# Patient Record
Sex: Female | Born: 1935 | Race: White | Hispanic: No | Marital: Married
Health system: Southern US, Community
[De-identification: ages and names within clinical notes are randomized; demographics above are authoritative.]

---

## 2004-12-18 ENCOUNTER — Ambulatory Visit: Payer: Self-pay | Admitting: Internal Medicine

## 2005-04-25 ENCOUNTER — Ambulatory Visit: Payer: Self-pay | Admitting: Internal Medicine

## 2005-06-27 ENCOUNTER — Ambulatory Visit: Payer: Self-pay | Admitting: Oncology

## 2005-07-10 ENCOUNTER — Ambulatory Visit: Payer: Self-pay | Admitting: Oncology

## 2005-08-25 ENCOUNTER — Inpatient Hospital Stay: Payer: Self-pay | Admitting: Infectious Diseases

## 2005-08-31 ENCOUNTER — Inpatient Hospital Stay (HOSPITAL_COMMUNITY): Admission: AD | Admit: 2005-08-31 | Discharge: 2005-09-11 | Payer: Self-pay | Admitting: Hospitalist

## 2005-08-31 ENCOUNTER — Ambulatory Visit: Payer: Self-pay | Admitting: Physical Medicine & Rehabilitation

## 2005-09-01 ENCOUNTER — Ambulatory Visit: Payer: Self-pay | Admitting: Hospitalist

## 2005-09-01 ENCOUNTER — Ambulatory Visit: Payer: Self-pay | Admitting: Oncology

## 2005-09-21 ENCOUNTER — Ambulatory Visit: Payer: Self-pay | Admitting: *Deleted

## 2005-09-24 ENCOUNTER — Emergency Department (HOSPITAL_COMMUNITY): Admission: EM | Admit: 2005-09-24 | Discharge: 2005-09-24 | Payer: Self-pay | Admitting: Emergency Medicine

## 2005-10-15 ENCOUNTER — Ambulatory Visit: Payer: Self-pay | Admitting: Internal Medicine

## 2005-11-30 ENCOUNTER — Ambulatory Visit: Payer: Self-pay | Admitting: *Deleted

## 2006-01-22 ENCOUNTER — Ambulatory Visit: Payer: Self-pay | Admitting: *Deleted

## 2006-01-31 ENCOUNTER — Ambulatory Visit: Payer: Self-pay | Admitting: *Deleted

## 2006-03-25 ENCOUNTER — Ambulatory Visit: Payer: Self-pay | Admitting: *Deleted

## 2006-03-29 ENCOUNTER — Ambulatory Visit: Payer: Self-pay | Admitting: *Deleted

## 2006-07-08 ENCOUNTER — Ambulatory Visit: Payer: Self-pay | Admitting: Oncology

## 2006-07-10 ENCOUNTER — Ambulatory Visit: Payer: Self-pay | Admitting: Oncology

## 2007-01-08 ENCOUNTER — Ambulatory Visit: Payer: Self-pay | Admitting: Oncology

## 2007-07-11 ENCOUNTER — Ambulatory Visit: Payer: Self-pay | Admitting: Oncology

## 2007-07-16 ENCOUNTER — Ambulatory Visit: Payer: Self-pay | Admitting: Oncology

## 2007-07-23 ENCOUNTER — Ambulatory Visit: Payer: Self-pay | Admitting: Oncology

## 2007-07-23 IMAGING — CT CT ABD-PELV W/ CM
1 of 2 series · 15 of 32 positions shown, 19 images · non-contrast
Comparison: none

REASON FOR EXAM: Pelvic mass
COMMENTS:

PROCEDURE:     CT  - CT ABDOMEN / PELVIS  W  - July 13, 2005  [DATE]
RESULT:     There are no prior similar studies available for comparison.

[Series 2: soft tissue · axial · 0.65mm/px · z∈[-610,-226]mm · 15 of 54 slices shown, 19 images]
[im 3/54  soft-tissue]
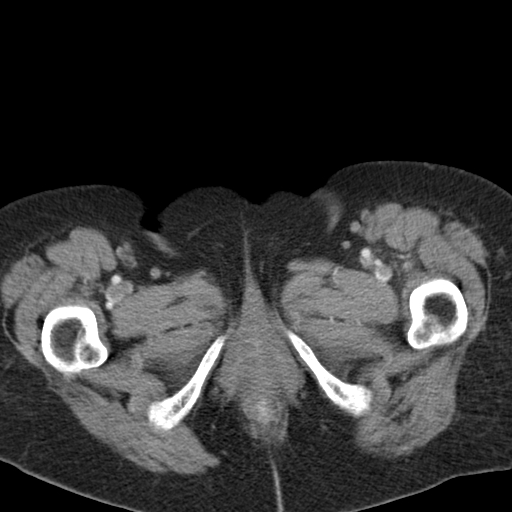
[im 3/54  bone]
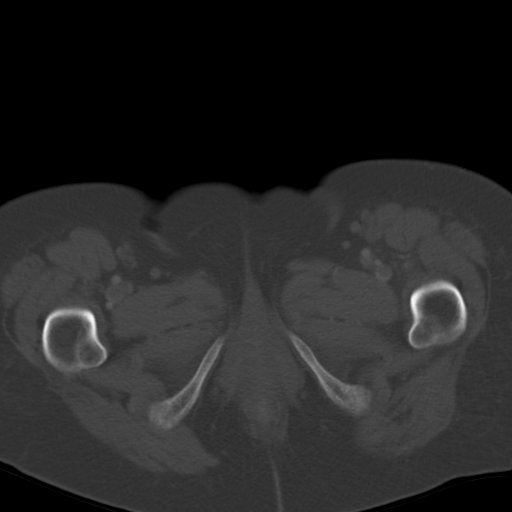
[im 7/54  soft-tissue]
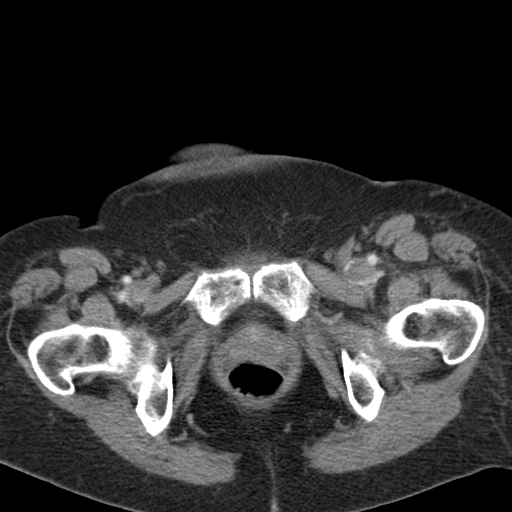
[im 11/54  soft-tissue]
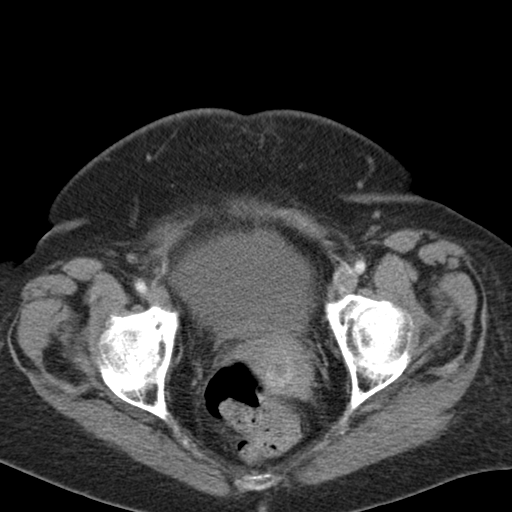
[im 15/54  soft-tissue]
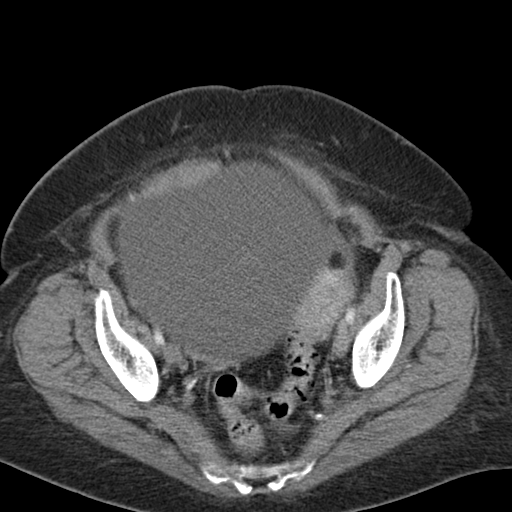
[im 20/54  soft-tissue]
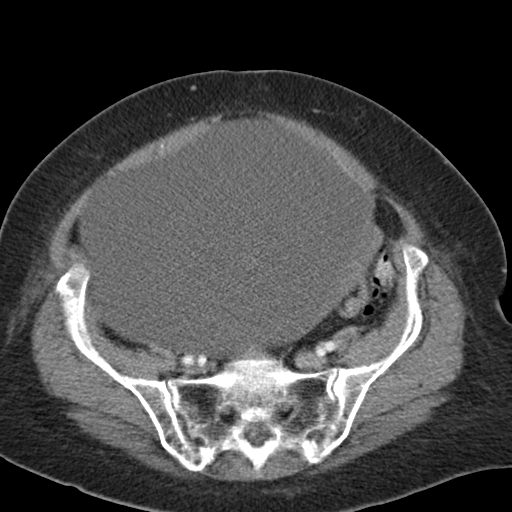
[im 24/54  soft-tissue]
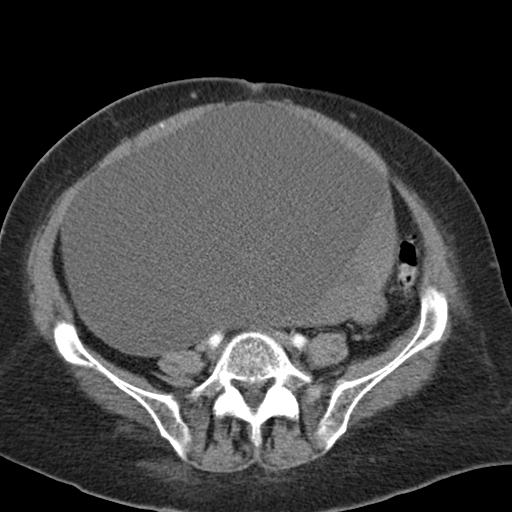
[im 28/54  soft-tissue]
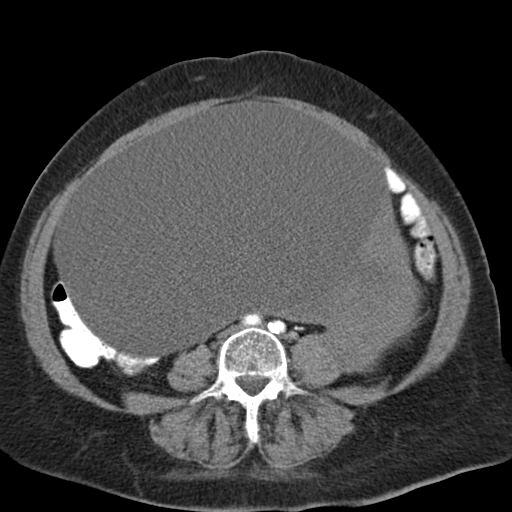
[im 30/54  soft-tissue]
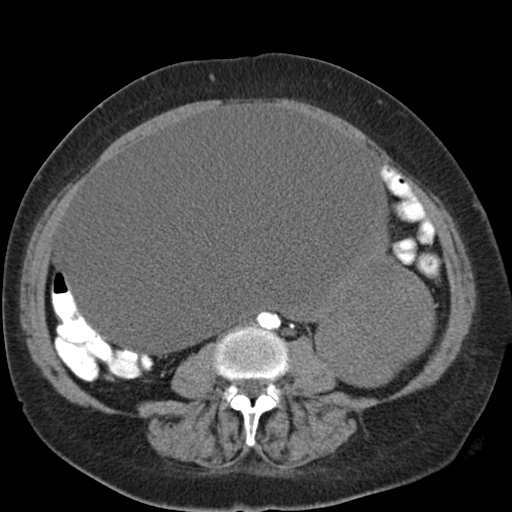
[im 34/54  soft-tissue]
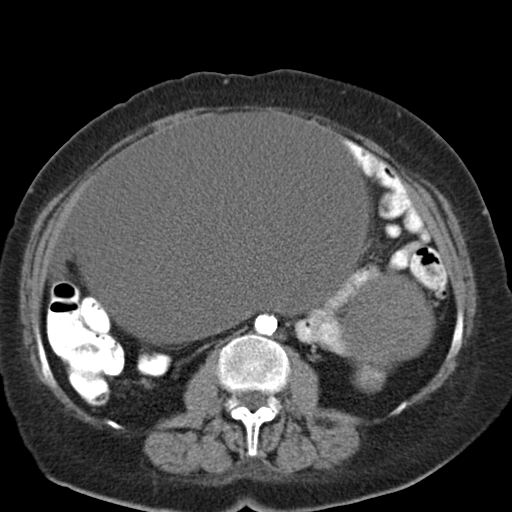
[im 34/54  bone]
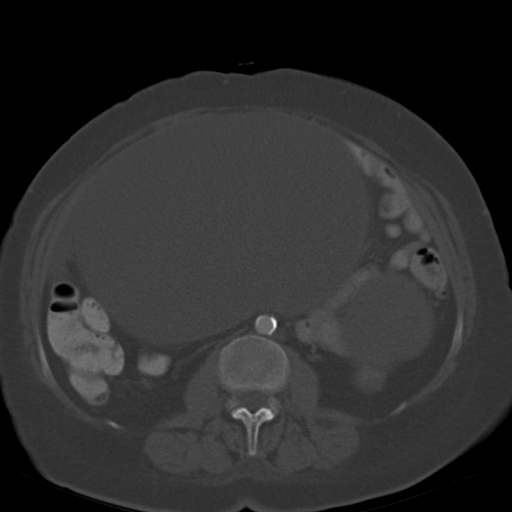
[im 39/54  soft-tissue]
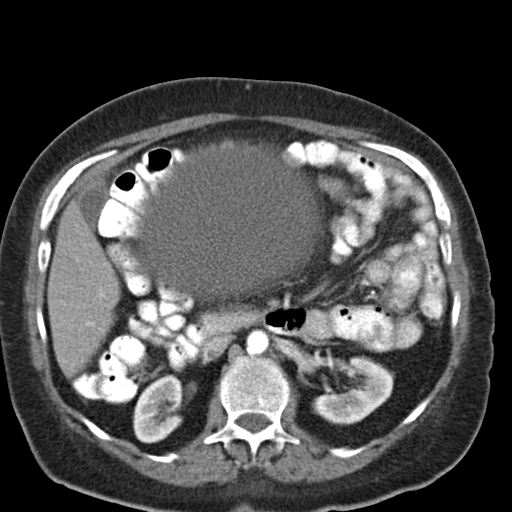
[im 43/54  soft-tissue]
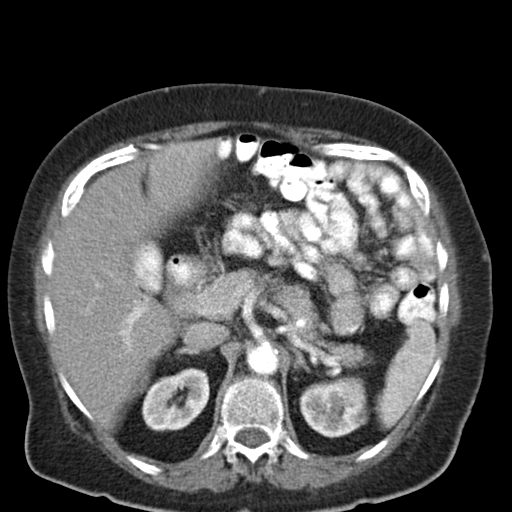
[im 45/54  lung]
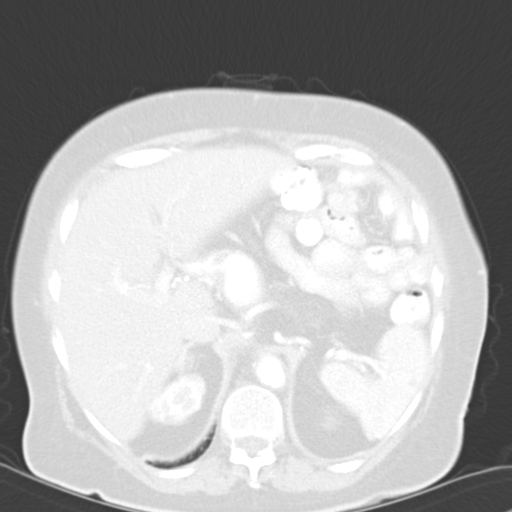
[im 47/54  soft-tissue]
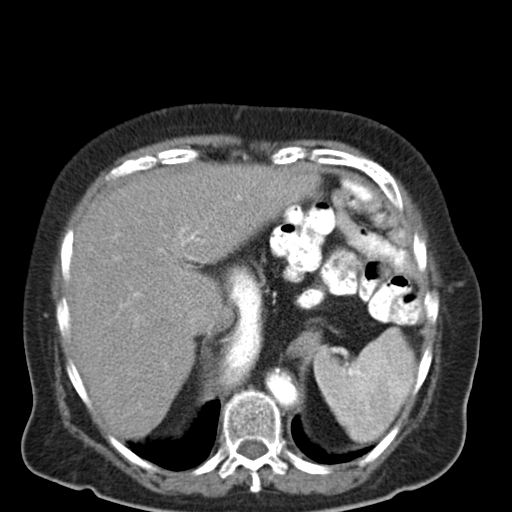
[im 47/54  lung]
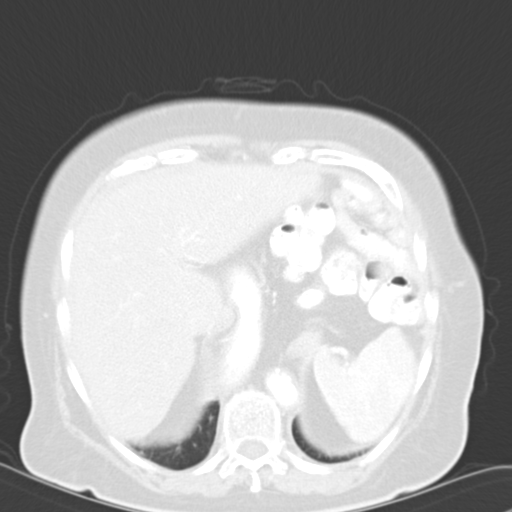
[im 49/54  lung]
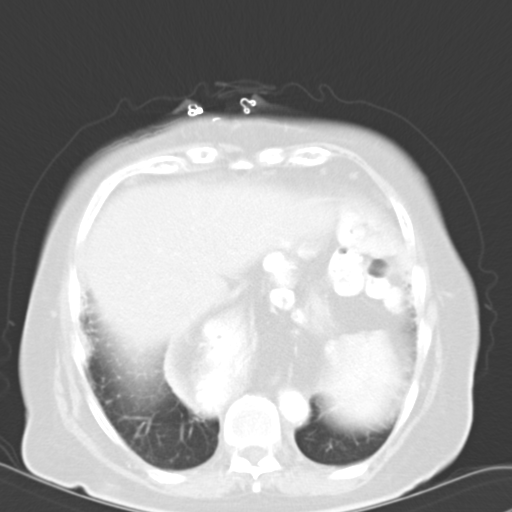
[im 51/54  soft-tissue]
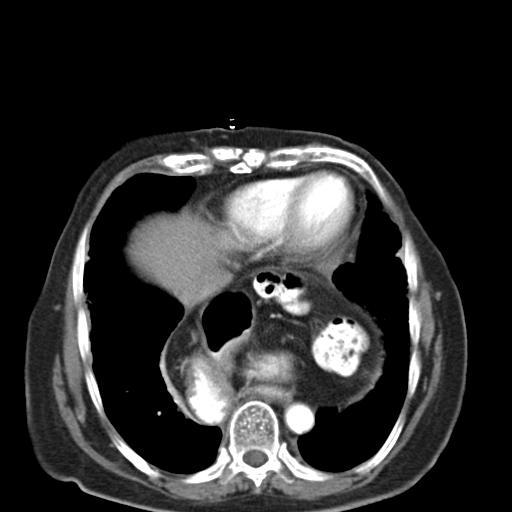
[im 51/54  lung]
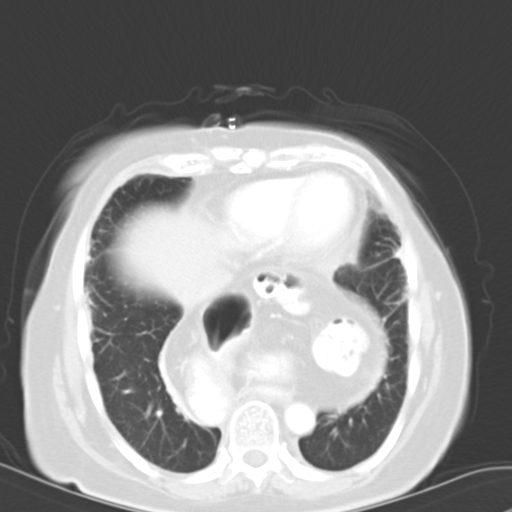

[15 of 32 positions shown; findings below may reference images not displayed]

FINDINGS: There is a large, low density mass arising from the pelvis which
appears to displace the uterus to the LEFT suggesting this may be a large,
RIGHT ovarian mass. This does have a soft tissue component about its LEFT
lateral margin near the level of the iliac crest and some lobulation with
low density superior to that, extending up to the lower pole of the LEFT
kidney. The superior extent of the mass is just above the level of the lower
margin of the RIGHT lobe of the liver. There is a large, hiatal hernia
containing a loop of colon as well as the stomach. This appears to be
non-obstructing. The liver and spleen are unremarkable. There is some low
density seen along the inferior aspect of the liver which may represent the
gallbladder. This is nondistended. The mass measures approximately 25.0 cm
from superior to inferior and shows a maximal transverse diameter extending
completely across the peritoneal cavity measuring up to approximately
cm from LEFT to RIGHT and shows an anterior to posterior extent of up to
approximately 16.0 cm. The lung bases are clear.
IMPRESSION: Large, predominantly cystic mass with some soft tissue
components arising from the adnexa on the RIGHT by the displacement of the
uterus to the LEFT. There is a soft tissue component as stated above along
the LEFT lateral aspect near the level of the iliac crest and involving the
LEFT iliac fossa region. There is some lobulation as well as described. No
definite additional areas of abnormal mass or cystic density.

## 2007-07-23 IMAGING — CR DG CHEST 2V
1 series · 2 of 2 positions shown · non-contrast
Comparison: none

REASON FOR EXAM: Mass
COMMENTS:

[Series 1: view not recorded · 0.17mm/px · 2 of 2 slices shown]
[im 1/2]
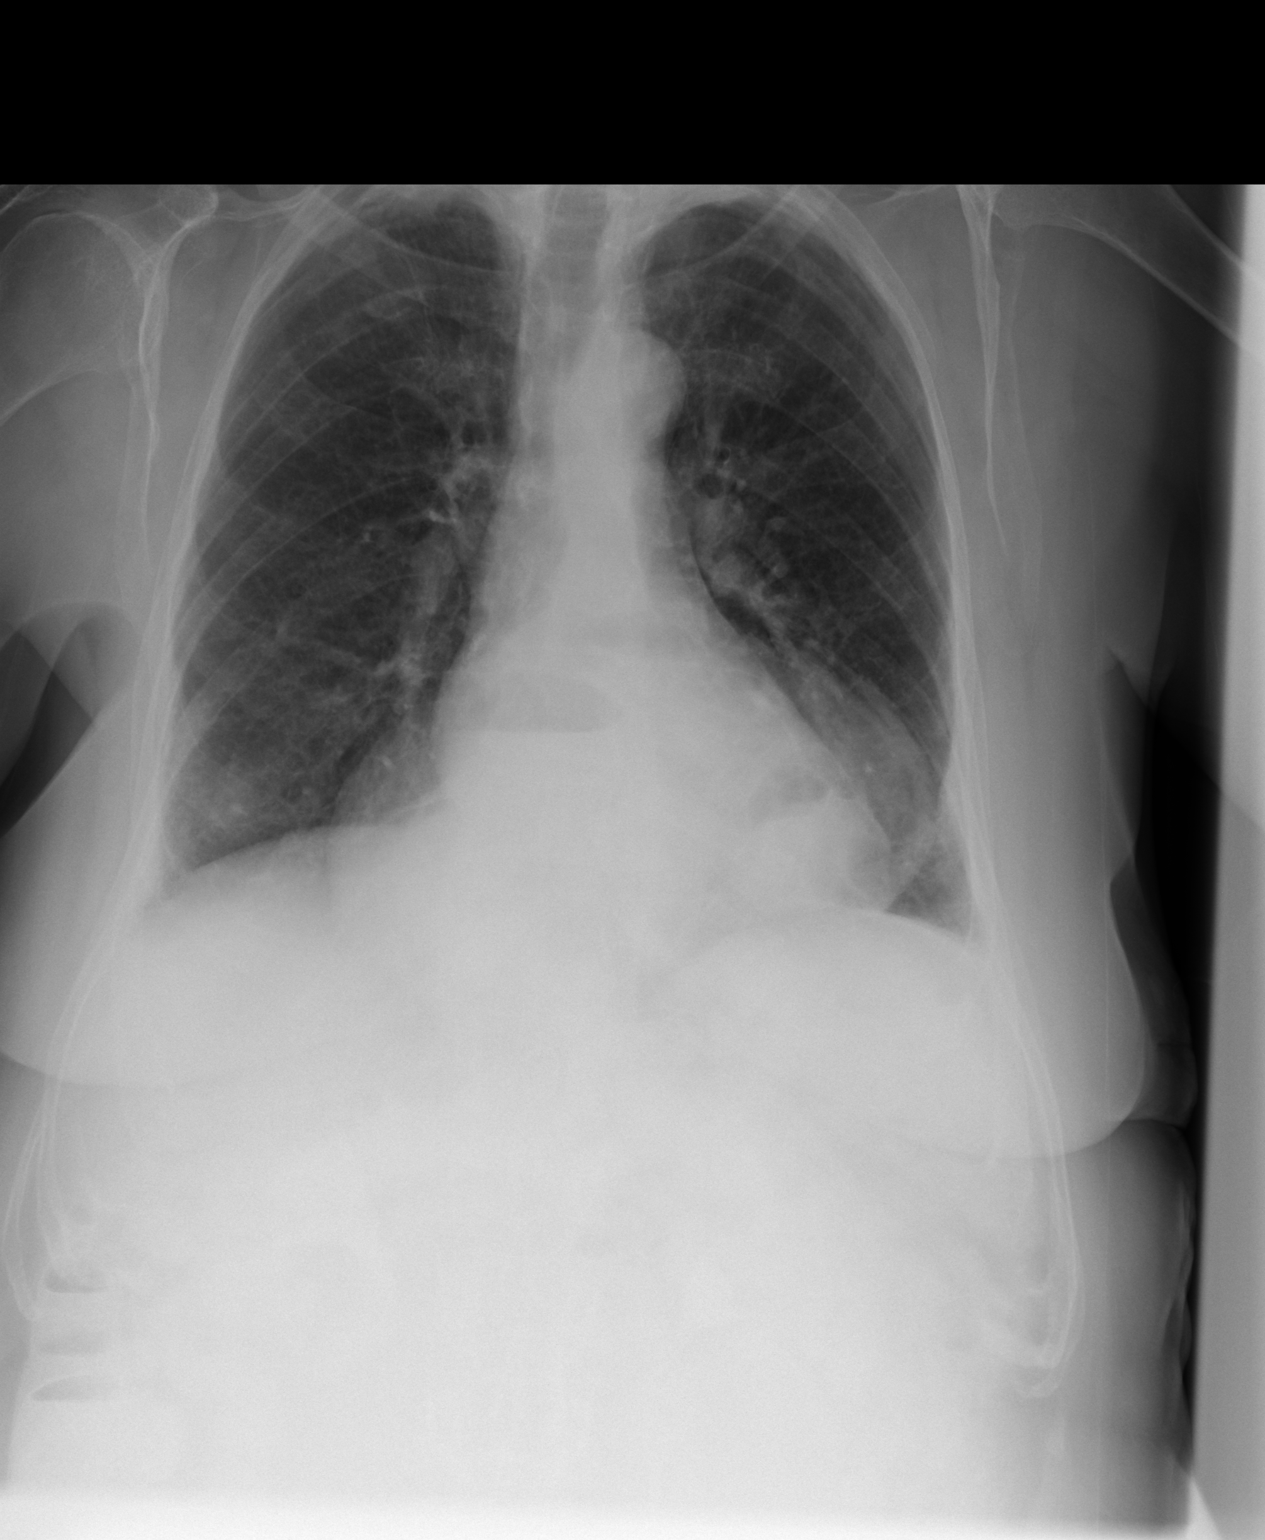
[im 2/2]
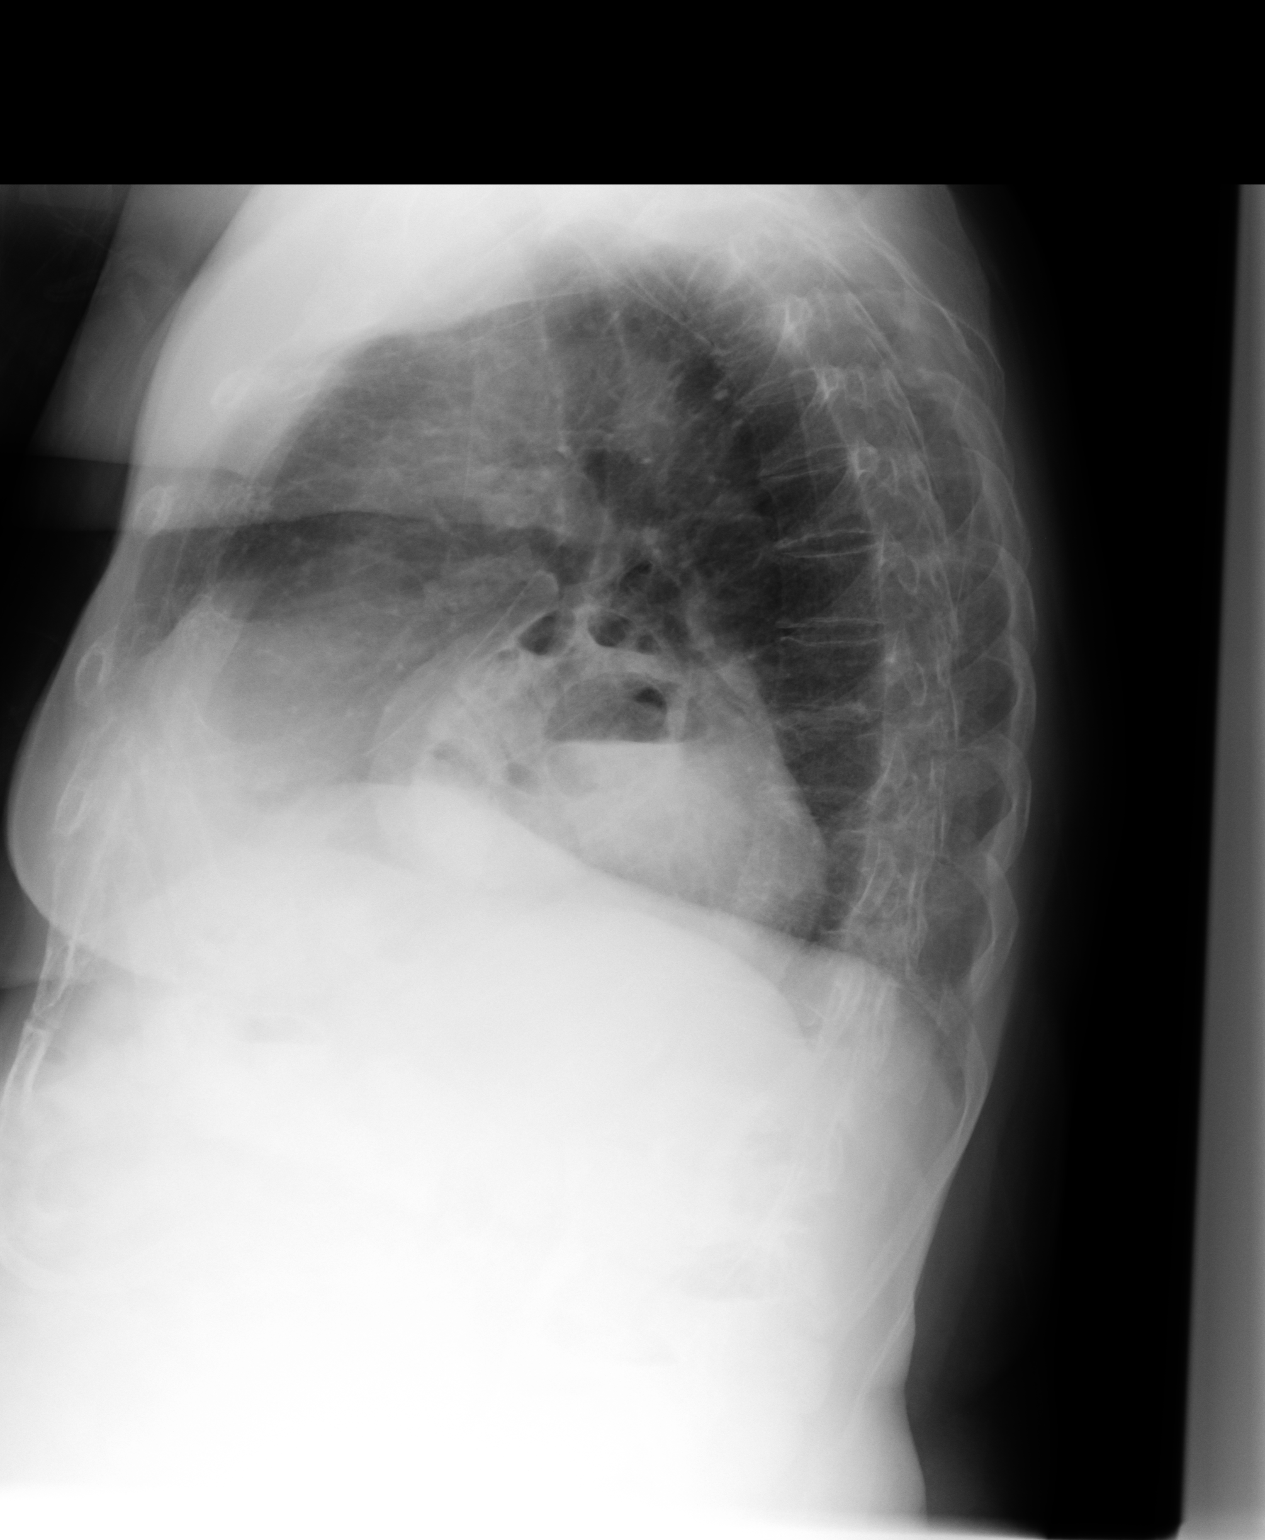

[2 of 2 positions shown; findings below may reference images not displayed]

PROCEDURE:     DXR - DXR CHEST PA (OR AP) AND LATERAL  - July 13, 2005  [DATE]

RESULT:     Comparison is made to the study of 06-20-99.

There are multiple air-fluid levels noted within the lower thorax.  This
likely reflects a large hiatal hernia  partially intrathoracic study.  The
lungs are well expanded.  There is no focal infiltrate.  The heart is not
enlarged.
IMPRESSION: There are findings which likely reflect a large intrathoracic study or
hiatal hernia.

I do not see evidence of CHF or of pneumonia.  It may be of value to
consider the patient for chest CT scanning if lymphadenopathy or possibly a
cavitary mass is strongly suspected.  The patient is known to have a hiatal
hernia.

## 2007-08-11 ENCOUNTER — Ambulatory Visit: Payer: Self-pay | Admitting: Oncology

## 2008-07-19 ENCOUNTER — Ambulatory Visit: Payer: Self-pay | Admitting: Oncology

## 2008-08-13 ENCOUNTER — Emergency Department: Payer: Self-pay | Admitting: Emergency Medicine

## 2008-08-13 ENCOUNTER — Other Ambulatory Visit: Payer: Self-pay

## 2008-10-10 ENCOUNTER — Ambulatory Visit: Payer: Self-pay | Admitting: Oncology

## 2008-10-20 ENCOUNTER — Ambulatory Visit: Payer: Self-pay | Admitting: Oncology

## 2008-11-09 ENCOUNTER — Ambulatory Visit: Payer: Self-pay | Admitting: Oncology

## 2009-10-10 ENCOUNTER — Ambulatory Visit: Payer: Self-pay | Admitting: Oncology

## 2009-10-20 ENCOUNTER — Ambulatory Visit: Payer: Self-pay | Admitting: Oncology

## 2009-11-09 ENCOUNTER — Ambulatory Visit: Payer: Self-pay | Admitting: Oncology

## 2009-11-16 ENCOUNTER — Ambulatory Visit: Payer: Self-pay | Admitting: Oncology

## 2009-12-16 ENCOUNTER — Ambulatory Visit: Payer: Self-pay | Admitting: Family Medicine

## 2012-03-20 ENCOUNTER — Ambulatory Visit: Payer: Self-pay | Admitting: Family Medicine

## 2012-06-13 ENCOUNTER — Ambulatory Visit: Payer: Self-pay | Admitting: Family Medicine

## 2012-08-29 ENCOUNTER — Inpatient Hospital Stay: Payer: Self-pay | Admitting: Internal Medicine

## 2012-08-29 LAB — COMPREHENSIVE METABOLIC PANEL
Alkaline Phosphatase: 86 U/L (ref 50–136)
Anion Gap: 11 (ref 7–16)
BUN: 51 mg/dL — ABNORMAL HIGH (ref 7–18)
Bilirubin,Total: 0.5 mg/dL (ref 0.2–1.0)
Chloride: 109 mmol/L — ABNORMAL HIGH (ref 98–107)
Co2: 22 mmol/L (ref 21–32)
Creatinine: 1.94 mg/dL — ABNORMAL HIGH (ref 0.60–1.30)
EGFR (African American): 26 — ABNORMAL LOW
EGFR (Non-African Amer.): 23 — ABNORMAL LOW
SGOT(AST): 27 U/L (ref 15–37)
SGPT (ALT): 16 U/L (ref 12–78)
Sodium: 142 mmol/L (ref 136–145)
Total Protein: 7.2 g/dL (ref 6.4–8.2)

## 2012-08-29 LAB — CBC WITH DIFFERENTIAL/PLATELET
Basophil #: 0 10*3/uL (ref 0.0–0.1)
Basophil %: 0.5 %
Eosinophil #: 0.2 10*3/uL (ref 0.0–0.7)
Eosinophil %: 3.9 %
HCT: 34.3 % — ABNORMAL LOW (ref 35.0–47.0)
HGB: 11.5 g/dL — ABNORMAL LOW (ref 12.0–16.0)
Lymphocyte #: 0.2 10*3/uL — ABNORMAL LOW (ref 1.0–3.6)
Lymphocyte %: 4.9 %
MCH: 32.4 pg (ref 26.0–34.0)
MCV: 97 fL (ref 80–100)
Monocyte #: 0.2 x10 3/mm (ref 0.2–0.9)
Monocyte %: 3.3 %
Neutrophil #: 4.2 10*3/uL (ref 1.4–6.5)
Platelet: 204 10*3/uL (ref 150–440)
RBC: 3.54 10*6/uL — ABNORMAL LOW (ref 3.80–5.20)
RDW: 14.6 % — ABNORMAL HIGH (ref 11.5–14.5)
WBC: 4.8 10*3/uL (ref 3.6–11.0)

## 2012-08-29 LAB — URINALYSIS, COMPLETE
Bilirubin,UR: NEGATIVE
Blood: NEGATIVE
Glucose,UR: NEGATIVE mg/dL (ref 0–75)
Granular Cast: 6
Hyaline Cast: 17
Ketone: NEGATIVE
Ph: 5 (ref 4.5–8.0)
RBC,UR: 8 /HPF (ref 0–5)
Squamous Epithelial: 2
WBC UR: 226 /HPF (ref 0–5)

## 2012-08-29 LAB — TROPONIN I: Troponin-I: <0.02 ng/mL

## 2012-08-30 LAB — CBC WITH DIFFERENTIAL/PLATELET
Basophil #: 0 10*3/uL (ref 0.0–0.1)
Basophil %: 0.3 %
Eosinophil %: 5.7 %
HCT: 30.9 % — ABNORMAL LOW (ref 35.0–47.0)
HGB: 10.2 g/dL — ABNORMAL LOW (ref 12.0–16.0)
Lymphocyte #: 0.3 10*3/uL — ABNORMAL LOW (ref 1.0–3.6)
Lymphocyte %: 7.1 %
MCH: 32 pg (ref 26.0–34.0)
MCV: 97 fL (ref 80–100)
Monocyte #: 0.2 x10 3/mm (ref 0.2–0.9)
Monocyte %: 4.7 %
Neutrophil #: 3.7 10*3/uL (ref 1.4–6.5)
Neutrophil %: 82.2 %
Platelet: 163 10*3/uL (ref 150–440)
RBC: 3.2 10*6/uL — ABNORMAL LOW (ref 3.80–5.20)
RDW: 14.8 % — ABNORMAL HIGH (ref 11.5–14.5)
WBC: 4.5 10*3/uL (ref 3.6–11.0)

## 2012-08-30 LAB — BASIC METABOLIC PANEL
BUN: 38 mg/dL — ABNORMAL HIGH (ref 7–18)
Calcium, Total: 8.3 mg/dL — ABNORMAL LOW (ref 8.5–10.1)
Chloride: 114 mmol/L — ABNORMAL HIGH (ref 98–107)
Co2: 18 mmol/L — ABNORMAL LOW (ref 21–32)
EGFR (African American): 40 — ABNORMAL LOW
EGFR (Non-African Amer.): 35 — ABNORMAL LOW
Glucose: 78 mg/dL (ref 65–99)
Osmolality: 300 (ref 275–301)
Potassium: 3.8 mmol/L (ref 3.5–5.1)

## 2012-08-30 LAB — MAGNESIUM: Magnesium: 1.8 mg/dL

## 2012-08-31 LAB — BASIC METABOLIC PANEL
Anion Gap: 11 (ref 7–16)
BUN: 25 mg/dL — ABNORMAL HIGH (ref 7–18)
Calcium, Total: 8.4 mg/dL — ABNORMAL LOW (ref 8.5–10.1)
Co2: 19 mmol/L — ABNORMAL LOW (ref 21–32)
Creatinine: 1.2 mg/dL (ref 0.60–1.30)
EGFR (African American): 47 — ABNORMAL LOW
EGFR (Non-African Amer.): 41 — ABNORMAL LOW
Glucose: 83 mg/dL (ref 65–99)
Osmolality: 290 (ref 275–301)
Potassium: 3.9 mmol/L (ref 3.5–5.1)
Sodium: 144 mmol/L (ref 136–145)

## 2012-09-30 ENCOUNTER — Ambulatory Visit: Payer: Self-pay | Admitting: Podiatry

## 2012-09-30 LAB — CBC WITH DIFFERENTIAL/PLATELET
Basophil #: 0.1 10*3/uL (ref 0.0–0.1)
Basophil %: 0.7 %
Eosinophil #: 0.1 10*3/uL (ref 0.0–0.7)
Eosinophil %: 1 %
HCT: 33.4 % — ABNORMAL LOW (ref 35.0–47.0)
HGB: 11.1 g/dL — ABNORMAL LOW (ref 12.0–16.0)
Lymphocyte #: 1.3 10*3/uL (ref 1.0–3.6)
Lymphocyte %: 12.2 %
MCH: 31.8 pg (ref 26.0–34.0)
MCHC: 33.1 g/dL (ref 32.0–36.0)
MCV: 96 fL (ref 80–100)
Monocyte #: 0.7 x10 3/mm (ref 0.2–0.9)
Monocyte %: 6.4 %
Neutrophil #: 8.8 10*3/uL — ABNORMAL HIGH (ref 1.4–6.5)
Neutrophil %: 79.7 %
Platelet: 204 10*3/uL (ref 150–440)
RBC: 3.48 10*6/uL — ABNORMAL LOW (ref 3.80–5.20)
RDW: 15.1 % — ABNORMAL HIGH (ref 11.5–14.5)
WBC: 11 10*3/uL (ref 3.6–11.0)

## 2012-10-03 ENCOUNTER — Ambulatory Visit: Payer: Self-pay | Admitting: Podiatry

## 2012-10-07 LAB — WOUND CULTURE

## 2012-10-07 LAB — PATHOLOGY REPORT

## 2012-12-26 ENCOUNTER — Emergency Department: Payer: Self-pay | Admitting: Unknown Physician Specialty

## 2012-12-26 LAB — COMPREHENSIVE METABOLIC PANEL
Albumin: 3.3 g/dL — ABNORMAL LOW (ref 3.4–5.0)
Anion Gap: 9 (ref 7–16)
Bilirubin,Total: 0.4 mg/dL (ref 0.2–1.0)
Calcium, Total: 9.7 mg/dL (ref 8.5–10.1)
Chloride: 108 mmol/L — ABNORMAL HIGH (ref 98–107)
Co2: 22 mmol/L (ref 21–32)
Creatinine: 1.63 mg/dL — ABNORMAL HIGH (ref 0.60–1.30)
EGFR (African American): 32 — ABNORMAL LOW
EGFR (Non-African Amer.): 28 — ABNORMAL LOW
Glucose: 91 mg/dL (ref 65–99)
Osmolality: 289 (ref 275–301)
Potassium: 4.7 mmol/L (ref 3.5–5.1)
SGOT(AST): 34 U/L (ref 15–37)
Sodium: 139 mmol/L (ref 136–145)

## 2012-12-26 LAB — T4, FREE: Free Thyroxine: 1.2 ng/dL (ref 0.76–1.46)

## 2012-12-26 LAB — URINALYSIS, COMPLETE
Glucose,UR: NEGATIVE mg/dL (ref 0–75)
Nitrite: NEGATIVE
RBC,UR: <1 /HPF (ref 0–5)
Squamous Epithelial: <1

## 2012-12-26 LAB — CBC
HCT: 27.7 % — ABNORMAL LOW (ref 35.0–47.0)
HGB: 9.1 g/dL — ABNORMAL LOW (ref 12.0–16.0)
MCH: 31.9 pg (ref 26.0–34.0)
MCHC: 32.9 g/dL (ref 32.0–36.0)
MCV: 97 fL (ref 80–100)
Platelet: 271 10*3/uL (ref 150–440)
RDW: 15.7 % — ABNORMAL HIGH (ref 11.5–14.5)
WBC: 2.6 10*3/uL — ABNORMAL LOW (ref 3.6–11.0)

## 2012-12-26 LAB — TROPONIN I: Troponin-I: <0.02 ng/mL

## 2012-12-27 LAB — URINE CULTURE

## 2013-01-07 ENCOUNTER — Observation Stay: Payer: Self-pay | Admitting: Internal Medicine

## 2013-01-07 LAB — URINALYSIS, COMPLETE
Bacteria: NONE SEEN
Bilirubin,UR: NEGATIVE
Blood: NEGATIVE
Glucose,UR: NEGATIVE mg/dL (ref 0–75)
Hyaline Cast: 11
Ketone: NEGATIVE
Leukocyte Esterase: NEGATIVE
Nitrite: NEGATIVE
Ph: 5 (ref 4.5–8.0)
Protein: NEGATIVE
RBC,UR: <1 /HPF (ref 0–5)
Specific Gravity: 1.021 (ref 1.003–1.030)
Squamous Epithelial: 1
WBC UR: 1 /HPF (ref 0–5)

## 2013-01-07 LAB — BASIC METABOLIC PANEL
Anion Gap: 9 (ref 7–16)
BUN: 53 mg/dL — ABNORMAL HIGH (ref 7–18)
Calcium, Total: 9 mg/dL (ref 8.5–10.1)
Chloride: 109 mmol/L — ABNORMAL HIGH (ref 98–107)
Co2: 20 mmol/L — ABNORMAL LOW (ref 21–32)
Creatinine: 1.83 mg/dL — ABNORMAL HIGH (ref 0.60–1.30)
EGFR (African American): 28 — ABNORMAL LOW
EGFR (Non-African Amer.): 24 — ABNORMAL LOW
Glucose: 107 mg/dL — ABNORMAL HIGH (ref 65–99)
Osmolality: 291 (ref 275–301)
Potassium: 5.1 mmol/L (ref 3.5–5.1)
Sodium: 138 mmol/L (ref 136–145)

## 2013-01-07 LAB — CBC
HCT: 27.8 % — ABNORMAL LOW (ref 35.0–47.0)
HGB: 9.5 g/dL — ABNORMAL LOW (ref 12.0–16.0)
MCH: 33.4 pg (ref 26.0–34.0)
MCHC: 34.3 g/dL (ref 32.0–36.0)
MCV: 97 fL (ref 80–100)
Platelet: 241 10*3/uL (ref 150–440)
RBC: 2.85 10*6/uL — ABNORMAL LOW (ref 3.80–5.20)
RDW: 16.3 % — ABNORMAL HIGH (ref 11.5–14.5)
WBC: 7.9 10*3/uL (ref 3.6–11.0)

## 2013-01-07 LAB — TROPONIN I: Troponin-I: <0.02 ng/mL

## 2013-01-08 LAB — BASIC METABOLIC PANEL
Anion Gap: 9 (ref 7–16)
BUN: 40 mg/dL — ABNORMAL HIGH (ref 7–18)
Calcium, Total: 9 mg/dL (ref 8.5–10.1)
Chloride: 113 mmol/L — ABNORMAL HIGH (ref 98–107)
Co2: 20 mmol/L — ABNORMAL LOW (ref 21–32)
Creatinine: 1.34 mg/dL — ABNORMAL HIGH (ref 0.60–1.30)
EGFR (African American): 41 — ABNORMAL LOW
EGFR (Non-African Amer.): 36 — ABNORMAL LOW
Glucose: 69 mg/dL (ref 65–99)
Osmolality: 291 (ref 275–301)
Potassium: 4.6 mmol/L (ref 3.5–5.1)
Sodium: 142 mmol/L (ref 136–145)

## 2013-01-08 LAB — HEMOGLOBIN: HGB: 8.9 g/dL — ABNORMAL LOW (ref 12.0–16.0)

## 2013-01-09 LAB — HEPATIC FUNCTION PANEL A (ARMC)
Albumin: 2.7 g/dL — ABNORMAL LOW (ref 3.4–5.0)
Alkaline Phosphatase: 100 U/L (ref 50–136)
Bilirubin, Direct: 0.2 mg/dL (ref 0.00–0.20)
Bilirubin,Total: 0.5 mg/dL (ref 0.2–1.0)
SGOT(AST): 28 U/L (ref 15–37)
SGPT (ALT): 20 U/L (ref 12–78)
Total Protein: 6.3 g/dL — ABNORMAL LOW (ref 6.4–8.2)

## 2013-01-09 LAB — IRON AND TIBC
Iron Bind.Cap.(Total): 91 ug/dL — ABNORMAL LOW (ref 250–450)
Iron Saturation: 63 %
Iron: 57 ug/dL (ref 50–170)
Unbound Iron-Bind.Cap.: 34 ug/dL

## 2013-01-09 LAB — HEMOGLOBIN: HGB: 8 g/dL — ABNORMAL LOW (ref 12.0–16.0)

## 2013-01-09 LAB — BASIC METABOLIC PANEL
Anion Gap: 7 (ref 7–16)
BUN: 22 mg/dL — ABNORMAL HIGH (ref 7–18)
Calcium, Total: 8.7 mg/dL (ref 8.5–10.1)
Chloride: 113 mmol/L — ABNORMAL HIGH (ref 98–107)
Co2: 21 mmol/L (ref 21–32)
Creatinine: 0.93 mg/dL (ref 0.60–1.30)
EGFR (African American): >60
EGFR (Non-African Amer.): 55 — ABNORMAL LOW
Glucose: 78 mg/dL (ref 65–99)
Osmolality: 283 (ref 275–301)
Potassium: 4.3 mmol/L (ref 3.5–5.1)
Sodium: 141 mmol/L (ref 136–145)

## 2013-01-09 LAB — FERRITIN: Ferritin (ARMC): 486 ng/mL — ABNORMAL HIGH (ref 8–388)

## 2014-09-09 DEATH — deceased

## 2015-03-29 NOTE — Discharge Summary (Signed)
PATIENT NAME:  Veronica Robbins, [name] MR#:  [id number] DATE OF BIRTH:  1923/04/13  DATE OF ADMISSION:  08/29/2012 DATE OF DISCHARGE:  09/01/2012  PRESENTING COMPLAINT: Weakness, diarrhea, and cough.   DISCHARGE DIAGNOSES:  1. Urinary tract infection.  2. Weakness.  3. Rheumatoid arthritis.   MEDICATIONS AT DISCHARGE: 1. Ciprofloxacin 250 p.o. daily for three more days.  2. Methotrexate 2.5 mg 4 tablets once a week on Monday.  3. Metoprolol 25 mg 1 tablet b.i.d.  4. Omeprazole 20 mg daily.  5. Promethazine 25 mg b.i.d.  6. Vitamin D 50,000 international units 1 capsule once a month on first Monday of the month.  7. Folic acid 0.8 mg p.o. daily.  8. Multivitamin p.o. daily.  9. Vitamin D3 5000 international units daily.  10. Meloxicam 7.5 mg b.i.d.   REFERRAL: Home health for wound care. Home health PT and RN to follow.   FOLLOW-UP:  1. Follow-up with Dr. Orland Jarredroxler of  Podiatry as outpatient.  2. Follow-up with Dr. Elease HashimotoMaloney.  LABS AT DISCHARGE: Glucose 83, BUN 25, creatinine 1.20, sodium 144, potassium 3.9, chloride 114. Urine culture mixed bacterial organisms. White count 4.5, hemoglobin and hematocrit 10.2 and 30.9, platelet count 163. Magnesium 1.8. Urinalysis positive for urinary tract infection. Beta Strep result was negative.   Chest x-ray is suggestive of interstitial edema which may be of cardiac or noncardiac reasons.   BRIEF SUMMARY OF HOSPITAL COURSE: Ms. Veronica Robbins is an 81 year old very pleasant Caucasian female with past medical history of dementia, osteoarthritis, and rheumatoid arthritis who came in with poor p.o. intake, not feeling well, low-grade fever, hypotension, orthostasis, and admitted with acute renal failure likely from prerenal azotemia from poor p.o. intake, possibly medications, NSAIDs as well. She was started on IV fluids. Her creatinine is back to normal. She was started on clear liquids and tolerated regular prior to discharge.  1. Presyncope in the setting of  orthostatic hypotension and dehydration, improved after IV fluids.  2. Chronic tachycardia, on beta-blockers.  3. Diarrhea, resolved. No abdominal tenderness on examination.  4. Urinary tract infection, now on Cipro. Will finish up a 5 to 7 day course of treatment.  5. Dementia. The patient appears to be at her baseline.  6. Osteoarthritis with rheumatoid arthritis. Will hold NSAIDs. The patient was given p.r.n. Tylenol. Continued her methotrexate.  7. Podiatry consult for draining ulcer in the left foot with significant deformity. Dr. Orland Jarredroxler saw the patient in the hospital and has wound care nursing instructions given which will be followed by home health nurse.   CODE STATUS: The patient remained a NO CODE, DO NOT RESUSCITATE.   She will be followed by home health PT and RN at home.    TIME SPENT: 40 minutes.   ____________________________ Wylie HailSona A. Allena KatzPatel, MD sap:drc D: 09/01/2012 13:44:43 ET T: 09/02/2012 14:21:23 ET JOB#: [id number]  cc: Cassidie Veiga A. Allena KatzPatel, MD, <Dictator> Rhona RaiderMatthew G. Troxler, DPM Leo GrosserNancy J. Maloney, MD Willow OraSONA A Severo Beber MD ELECTRONICALLY SIGNED 09/02/2012 16:11

## 2015-03-29 NOTE — H&P (Signed)
PATIENT NAME:  Veronica Robbins, Veronica Robbins MR#:  [id number] DATE OF BIRTH:  October 19, 1923  DATE OF ADMISSION:  08/29/2012  REFERRING PHYSICIAN:  Dr. Clemens Catholic  PRIMARY CARE PHYSICIAN: Dr. Elease Hashimoto   CHIEF COMPLAINT: Fall, weakness, diarrhea.   HISTORY OF PRESENT ILLNESS: The patient is an 81 year old Caucasian female with severe rheumatoid and osteoarthritis, hypertension, and dementia who lives with her niece who is also her POA, name Veronica Robbins.  The patient was brought in by the family after experiencing several days of weakness, poor p.o. intake, and a presyncopal episode today. She has been having low-grade fevers as an outpatient per family with cough and sputum production. She also has a family member with strep throat who is on an antibiotic. The patient answers no to every question and denies any complaints whatsoever and the information was obtained per staff and the niece who is here. Besides the cough the patient did have several bouts of diarrhea after amoxicillin was started for presumed strep throat yesterday. She took amoxicillin yesterday morning and then last night and diarrhea started last night. She has had runny watery stools 6 to 7 times so far. The patient denies abdominal pain as well. Here she was noted to have acute renal failure with creatinine of 1.9 and was severely orthostatic. Although the numbers are not in the chart, per ER physician and her notes,  pressures dropped from 101 systolic to 67 while standing with increased heart rate from 87 to 110. She has had some fluids. Again, the patient denies any complaints.   The patient had a presyncopal episode this morning while standing up. The patient had walked off of the commode and her eyes rolled back per niece and she fell without loss of consciousness and was caught by the niece's daughter and brought down.  There was no loss of consciousness and the patient did not complain of any symptoms at that time.   PAST MEDICAL HISTORY:   1. Breast cancer status post mastectomy. 2. Abdominal tumor, per patient was noncancerous, resected several years ago.  3. Hypertension.  4. Chronic back pain.  5. Appendectomy.  6. Dementia.  7. Rheumatoid of arthritis. 8. Osteoarthritis.   ALLERGIES: No known drug allergies.   MEDICATIONS:  1. Amoxicillin 875 mg 2 times a day, started yesterday.  2. Folic acid 0.8 mg, 1 tab daily.  3. Meloxicam 7.5 mg, 1 tab 2 times a day.  4. Methotrexate 2.5 mg, 4 tabs once a week on Mondays.  5. Metoprolol tartrate 25 mg, 1 tab 2 times a day.  6. Multivitamin 1 tab daily.  7. Omeprazole 20 mg daily. 8. Promethazine 25 mg 2 times a day.  9. Vitamin D 50,000 international units, one cap the first Monday of the month.  10. Vitamin D3 50,000 international units 1 tab daily.   FAMILY HISTORY: Dad with cancer. Mom with heart disease.   SOCIAL HISTORY: No history of tobacco, alcohol or drug use. The patient walks with a walker, lives with her family.  REVIEW OF SYSTEMS:  Unable to obtain given dementia and the patient states no to everything.   PHYSICAL EXAMINATION:  VITAL SIGNS: Temperature on arrival 98.5, pulse rate 93. Initial blood pressure 93/43. Last blood pressure 106/40. Per ER physician's note, orthostatics blood pressure while lying 101/54, heart rate 87, and while standing 67/36 with heart rate of 110.   GENERAL: The patient is an elderly Caucasian female lying in bed in no obvious distress, not on oxygen.   HEENT:  Normocephalic, atraumatic. Pupils are equal and reactive. Very dry mucous membranes.   NECK: Supple. No thyroid tenderness. No cervical lymphadenopathy.   CARDIOVASCULAR: S1, S2, regular rate and rhythm. No murmurs, rubs, or gallops.   LUNGS: Some crackles on the left base, otherwise good air entry without rhonchi or wheezing.   ABDOMEN: Soft, nontender, nondistended. Hypoactive bowel sounds.   EXTREMITIES: No significant lower extremity edema. Swelling and  deformities on both upper extremities.   NEUROLOGIC: I am unable to do a full neurological exam as the patient does not follow commands. However, face is symmetrical. Tongue protrudes midline. Strength appears to be five out of five in all extremities.   PSYCH: The patient is awake, alert, but confused, oriented to niece and to month.   SKIN: There is subcentimeter darkened eschar on the right great toe without any redness, warmth, or drainage.   LABORATORY DATA: BUN 51, creatinine 1.94, sodium 142, potassium 4.2, chloride 109, albumin 3, otherwise within normal limits. Troponin negative. CK-MB 0.8. WBC 4.8, hemoglobin 11.5, hematocrit 34.3, platelets 204. Chest x-ray PA and lateral: Findings suggestive of low-grade interstitial edema which may be of cardiac or noncardiac cause, no classic alveolar pneumonia, large hiatal hernia partially intrathoracic. EKG: Sinus rhythm, rate is 90, bifascicular block. No acute ST elevations or depressions. Similar to prior EKG.   ASSESSMENT AND PLAN: We have an 81 year old Caucasian female with significant dementia, osteoarthritis, rheumatoid arthritis, and hypertension, feeling unwell for several days with decreased p.o. intake and low-grade fevers with cough which is productive per family, who comes in with diarrhea since last night, found with acute renal failure, hypotension, orthostatic, and had a presyncopal episode. At this point, we will admit the patient to the hospital.  1. Acute renal failure likely is from poor p.o. intake and also possibly medications as she is on NSAIDs. The patient also did have bouts of hypotension while standing. We will hold the blood pressure medications and start the patient on gentle IV fluids. Recheck basic metabolic panel in the morning for creatinine check.  2. Presyncope. The patient did have a presyncopal episode while standing this morning. This was likely in the setting of orthostatic hypotension and renal failure and poor  p.o. intake. We will start the patient on gentle IV fluids, hold her blood pressure medications. There was no loss of consciousness and she was caught by family members and brought down, and it was very transient.  3. Hypertension. As above we will hold her blood pressure medications.  4. Diarrhea. This started after initiation of amoxicillin yesterday morning. She has no fever or leukocytosis and at this point we will hold amoxicillin. The diarrhea could be antibiotics related or could be Clostridium difficile. We will send cultures including Clostridium difficile and follow up. She has no abdominal tenderness on exam.  5. Possible fevers. The patient's family stated she had low-grade fevers in the last several days which cough and sputum production. Here x-ray does not show significant pneumonia nor is she febrile nor has leukocytosis here. Therefore, I would hold on antibiotics. It is possible she has bronchitis, but given the diarrhea starting yesterday, and given that also she is not symptomatic chronically I would hold on starting any antibiotics. I will check strep throats cultures, though.  6. Dementia. The patient appears to be at her baseline per family.  7. Osteoarthritis/rheumatoid arthritis. I will hold the NSAIDs and methotrexate at this point given the above and obtain a PT consult. We will also get a  dietary consult.   CODE STATUS:  The patient is DO NOT RESUSCITATE per family.  TOTAL TIME SPENT: 60 minutes.     ____________________________ Krystal Eaton, MD sa:bjt D: 08/29/2012 16:26:08 ET T: 08/29/2012 17:04:14 ET JOB#: [id number]  cc: Krystal Eaton, MD, <Dictator> Leo Grosser, MD Krystal Eaton MD ELECTRONICALLY SIGNED 09/12/2012 15:09

## 2015-03-29 NOTE — Consult Note (Signed)
PATIENT NAME:  Veronica Robbins, Veronica Robbins MR#:  [id number] DATE OF BIRTH:  [birth year]  DATE OF CONSULTATION:  09/01/2012  REFERRING PHYSICIAN:   CONSULTING PHYSICIAN:  Veronica Robbins, DPM  REASON FOR CONSULTATION: Wounds both feet in between fourth toe and fifth toe left foot and in between third and fourth toe right foot.   HISTORY OF PRESENT ILLNESS: The patient was admitted to the hospital because of some pulmonary issues and noted some wound in between these toes and I was consulted today. She has been in the hospital for a few days now. She has seen Veronica Robbins in the past, was supposed to have seen him on Friday of last week but was admitted to the hospital and did not make that appointment. Veronica Robbins is my partner.   PAST MEDICAL HISTORY:  1. Breast cancer, status post mastectomy. 2. Abdominal tumor.  3. Hypertension.  4. Chronic back pain.  5. Appendectomy. 6. Dementia. 7. Rheumatoid arthritis. 8. Osteoarthritis.   ALLERGIES: No known drug allergies.   CURRENT MEDICATIONS AT HOME:  1. Amoxicillin. 2. Folic acid. 3. Meloxicam. 4. Methotrexate. 5. Metoprolol. 6. Omeprazole. 7. Promethazine. 8. Vitamin supplements.   FAMILY HISTORY: Positive for father who had cancer, mother had heart disease.   SOCIAL HISTORY: Denies history of smoking. Denies alcohol or drug use. The patient is widowed. She is able to ambulate a little bit with a walker just to the bathroom but nothing much more than that. She also has a history of dementia herself. Her daughter was with her today.  PHYSICAL EXAMINATION:   GENERAL: Overall she is an 81 year old Caucasian female. She is relatively alert but with her dementia I communicated mostly with her daughter.   VITAL SIGNS: Temperature 98.2, pulse 90, respirations 19, blood pressure 108/64, pulse 78, pulse oximetry 93.   LOWER EXTREMITY EXAM: Vasculature DP pulses hard to palpate. She has noted edema to both feet and legs and some pitting edema. She does  have TED stockings on with toes covering up her digits at this point. She does not wear these at home but these were placed on her here in the hospital.   Orthopedically the patient has multiple structural deformities, severe hallux valgus deformities with significant digital deformities as well as a lot of pressure interdigitally on both feet creating a lot of stress and friction to the skin as a result of that pressure.   DERMATOLOGIC: The patient has an ulceration on the fourth toe laterally. It'Robbins about 5 mm diameter but has good granular base to it. No evidence of infection, just drainage from the area. There is a similar lesion on the fourth toe medially on the right foot as well. This may penetrate down a little deeper but none of these wound areas have much in the way of cellulitis at this juncture but potential for problems.   CLINICAL IMPRESSION: Pressure sores and ulcerations of both feet as noted secondary to severe deformity. Circulation she seems to have pretty good bleeding to the areas but it is hard to palpate pulses because of the edema.   TREATMENT PLAN: We just need to keep these separated more at this point. She needs several layers of gauze to try to put in between the toes in a wet-to-dry fashion in order to keep this stabilized. We used some of the Safe-Clens to moisten some gauze and then wedged them in between those sore areas to try to alleviate some of the pressure. Recommend keeping the toes open and  only close them with the TED hose at this point. Recommend no shoes. Recommend postop shoes for her when she does get up and go the bathroom. I'd even keep bedsheets and blankets off her toes at this juncture. She needs to follow-up in our office next week sometime. I would recommend home health to try to change dressings and get her daughter started with trying to get them to the point where it will be helpful with getting things improved.   ____________________________ Veronica Raider  Veronica Robbins, DPM mgt:drc D: 09/01/2012 13:06:56 ET T: 09/01/2012 13:40:02 ET JOB#: [id number]  cc: Veronica Raider Veronica Robbins, DPM, <Dictator> Veronica Sarin MD ELECTRONICALLY SIGNED 09/25/2012 15:16

## 2015-03-29 NOTE — Op Note (Signed)
PATIENT NAME:  Veronica Robbins, Veronica Robbins MR#:  [id number] DATE OF BIRTH:  1923-01-17  DATE OF PROCEDURE:  10/03/2012  PREOPERATIVE DIAGNOSIS: Chronic hammertoes with ulcerations bilateral fourth toes.   POSTOPERATIVE DIAGNOSES: 1. Chronic hammertoes with ulcerations bilateral fourth toes. 2. Osteomyelitis, left fourth toe.   PROCEDURE:  Not dictated.   SURGEON: Linus Galasodd Nikoleta Dady, DPM.   ANESTHESIA: Local MAC.   HEMOSTASIS: Pneumatic tourniquet bilateral ankles 250 mmHg.  ESTIMATED BLOOD LOSS: Minimal.  PATHOLOGY: Fourth toes bilateral.  CULTURES: Bone proximal phalanx, left fourth toe.  COMPLICATIONS: None apparent.   OPERATIVE INDICATIONS: This is an 81 year old female with chronic hammertoes from her rheumatoid arthritis. More recently she has developed ulcers over the last couple of months that have progressed down full thickness with excruciating pain and she elects for amputation.   OPERATIVE PROCEDURE: The patient was taken to the Operating Room and placed on the table in the supine position. Following satisfactory sedation, both of the fourth toe areas were anesthetized with 10 mL of 0.5% Sensorcaine plain. Pneumatic tourniquets were applied at the level of the ankle bilateral and the feet were prepped and draped in the usual sterile fashion. The right foot was exsanguinated and the tourniquet inflated to 250 mmHg. Attention was then directed to the distal aspect where an elliptical incision was made coursing from dorsal to plantar around the base of the fourth toe. The incision was carried sharply down to the level of bone and periosteal dissection carried back to the level of the metatarsophalangeal joint where the toe was disarticulated in toto. Good healthy tissues were noted. The wound was flushed with copious amounts of pulsed irrigation. The wound was then closed with a 4-0 nylon vertical mattress suture followed by simple interrupted sutures. A light bandage was applied and tourniquet was  released and blood flow noted to return immediately to the right foot in all digits.   Attention was then directed to the left foot where the exact same procedure was performed from inflation of the tourniquet to incision around the base of the fourth toe. Findings intraoperatively did reveal complete ulceration through the skin with exposed bone at the head of the proximal phalanx. There were some degenerative changes with loss of bone integrity. A rongeur was used to take a bone culture. The toe was likewise disarticulated as previously on the right and the wound was then flushed with copious amounts of sterile saline using a pulsed irrigation system. That wound was then closed in like manner with one 4-0 nylon vertical mattress suture followed by simple interrupted sutures. Xeroform and sterile bandages applied. The tourniquet was released and blood flow noted to return immediately to the left foot. The patient tolerated the procedures and anesthesia well and was transported to the postanesthesia care unit with vital signs stable and in good condition.  ____________________________ Linus Galasodd Verdine Grenfell, DPM tc:ap D: 10/03/2012 09:23:56 ET T: 10/03/2012 10:29:47 ET JOB#: [id number]  cc: Linus Galasodd Edd Reppert, DPM, <Dictator> Joelynn Dust DPM ELECTRONICALLY SIGNED 10/10/2012 10:11

## 2015-04-01 NOTE — H&P (Signed)
PATIENT NAME:  Veronica Robbins, BACCARI MR#:  [id number] DATE OF BIRTH:  [birth year]  DATE OF ADMISSION:  01/07/2013  PRIMARY CARE PHYSICIAN:  Dr. Lorie Phenix.   CHIEF COMPLAINT:  Loss of consciousness.   HISTORY OF PRESENT ILLNESS:  The patient is an 81 year old pleasant Caucasian female with history of rheumatoid arthritis and dementia.  She was in her usual state of health until today when her relative found her pale-looking, head down and was unresponsive for about 15 minutes.  She called EMS and transported to the hospital, during which the patient was fully awake here and responsive.  The patient has no specific complaints and denies any discomfort.  No chest pain.  No headache.  No focal weakness.  The patient was admitted for 24-hour observation.   REVIEW OF SYSTEMS:  She is pleasantly demented. CONSTITUTIONAL:  Denies any fever.  No chills.  No fatigue.  EYES:  No blurring of vision.  No double vision.  EARS, NOSE, THROAT:  No hearing impairment.  No sore throat.  No dysphagia.  CARDIOVASCULAR:  Denies any chest pain.  No shortness of breath.  No edema, but she had syncope earlier.  The patient is unaware and she does not know why she is here in this hospital.  RESPIRATORY:  No shortness of breath.  No cough.  No chest pain.  No hemoptysis.  GASTROINTESTINAL:  No abdominal pain, no vomiting, no diarrhea.  GENITOURINARY:  No dysuria.  No frequency of urination.  MUSCULOSKELETAL:  She had multiple joint deformities from her rheumatoid arthritis.  Denies any severe pain.  No muscular pain or swelling.  INTEGUMENTARY:  No skin rash.  No ulcers.  NEUROLOGY:  No focal weakness.  No seizure activity.  No headache.  PSYCHIATRY:  No anxiety.  No depression.  ENDOCRINE:  No polyuria or polydipsia.  No heat or cold intolerance.  HEMATOLOGY:  No easy bruisability.  No lymph node enlargement.    PAST MEDICAL HISTORY:  Rheumatoid arthritis and dementia, but she is able to communicate and get some history  from her although not in details.  Systemic hypertension, chronic back pain.   PAST SURGICAL HISTORY:  Foot surgery for osteoarthritis and osteomyelitis and surgery for hammertoes.  Appendectomy, spinal surgery and abdominal surgery to remove a benign tumor.  SOCIAL HABITS:  Nonsmoker.  No history of alcohol or drug abuse.   SOCIAL HISTORY:  She is widowed, lives at home with her niece in [place].   FAMILY HISTORY:  Both parents are deceased.  Her mother suffered from heart disease and her father had cancer.  The type is unknown.   ADMISSION MEDICATIONS:  She has no idea about the name of her medications, but the list from last admission includes folic acid 0.8 mg once a day, methotrexate 2.5 mg 4 tablets once a week on Monday.  Meloxicam 7.5 mg twice a day, metoprolol 25 mg twice a day, multivitamin once a day, omeprazole 20 mg once a day, promethazine 25 mg twice a day, vitamin D 5000 units once a day and 50,000 units once a month.   ALLERGIES:  No known drug allergies.   PHYSICAL EXAMINATION: VITAL SIGNS:  Blood pressure 134/51, respiratory rate 20, pulse 70, temperature 97.6, O2 saturation 96%.  GENERAL APPEARANCE:  Elderly female lying in bed in no acute distress.  She is fully awake and cooperative.  HEAD AND NECK:  No pallor.  No icterus.  No cyanosis.  EARS, NOSE, THROAT:  Ear examination revealed normal hearing.  No discharge.  No lesions.  Nasal mucosa was normal, no discharge, no bleeding.  No ulcers.  Oropharyngeal area was normal without ulcers, no oral thrush.  EYES:  Reveals normal eyelids and conjunctivae.  Pupils both are constricted.  I could not see reactivity to light.  NECK:  Is supple.  Trachea at midline.  No thyromegaly.  No cervical lymphadenopathy.  No masses.  HEART:  Revealed distant heart sounds, barely audible.  No S3 or S4.  No murmur.  No carotid bruits.   RESPIRATORY:  Revealed normal breathing pattern without use of accessory muscles.  No rales.  No wheezing.   ABDOMEN:  Is soft without tenderness.  No hepatosplenomegaly.  No masses.  No hernias.  SKIN:  Revealed few superficial ulcers on left hand, these are pressure ulcers on fingers from rubbing against deformed fingers.  No subcutaneous nodules. Callous formation on left foot under the first metatarsal phalangeal joint. There is mild redness of skin over shin area above left ankle but not sufficient to call it cellulitis. MUSCULOSKELETAL:  She has multiple joint deformities, especially small joints of hands and fingers bilaterally.  Also showed some deformity in her feet, especially the toes. Has only 4 toes in each foot. NEUROLOGIC:  Cranial nerves II-XII are intact.  No focal motor deficit.  PSYCHIATRIC:  The patient is alert.  She is oriented to the place being [facility].  She does not know the date, neither the month or the year.  She said it is the year 2000.  She was able to name the president of [place] being [address]   LABORATORY FINDINGS AND RADIOLOGIC DATA:  CT of the head showed diffuse cortical and cerebellar atrophy.  No acute findings.  Chest x-ray showed cardiomegaly.  No effusion.  No consolidation.  Evidence of hiatal hernia.  EKG showed normal sinus rhythm at rate of 95 per minute.  Right bundle branch block.  Otherwise unremarkable EKG.  Serum glucose 107, BUN 53, creatinine 1.8, both of these values are above her baseline.  In 2013, BUN and creatinine were 25 and 1.2 and in January of this year BUN was 47, creatinine 1.6.  Her sodium is 138, potassium 5.1.  Her estimated GFR is 24.  Troponin less than 0.02.  Her TSH earlier this month was 2.5.  CBC showed white count of 7900, hemoglobin 9.5 with hematocrit of 27.  This is her baseline compared to her hemoglobin and hematocrit earlier this month.  Platelet count 241.  Urinalysis was unremarkable.   ASSESSMENT: 1.  Syncope.  2.  Dehydration.  3.  Normocytic, normochromic anemia.  4.  Rheumatoid arthritis.  5.  Dementia.  6.   Few pressure ulcers stage II as noted above on her left hand fingers. 6.  Her other medical problems are as listed above.   PLAN:  We will admit the patient to the telemetry unit for 24-hour monitoring.  Follow up on the neurologic examination q. 2 hours, then q. 4 hours.  Gentle IV hydration.  Local care for the pressure ulcers. Continue home medications as listed above.  I spoke to the patient about LIVING WILL and also CODE STATUS.  She was unable to give me a straight answer, however in her last admission in September 2013, per her family the patient was DO NOT RESUSCITATE.  Her niece had left the hospital just before I came to examine the patient and I did not have the chance to verify this information.   TIME  SPENT IN EVALUATING THIS PATIENT AND REVIEWING MEDICAL RECORDS:  Took more than 55 minutes.    ____________________________ Yasseen Salls M. Rudene Rearwish, MD amd:ea D: 01/29/2014[place] 23:16:29 ET T: 01/08/2013 00:43:39 ET JOB#: [id number]  cc: Carney CornersAmir M. Rudene Rearwish, MD, <Dictator> Zollie ScaleAMIR M Jacquette Canales MD ELECTRONICALLY SIGNED 01/08/2013 3:38

## 2015-04-01 NOTE — Consult Note (Signed)
CHIEF COMPLAINT and HISTORY:   Subjective/Chief Complaint syncope    History of Present Illness Patient admitted to the hospital for what sounds like a prolonged syncopal episode.  Family reports that they though that she was "gone".  She woke up with EMS and has seemed clear since.  No unilateral arm or leg weakness or numbness, no speech or swallowing issues, and no visual symptoms.  Working with PT currently Her [place] suggested approaching 75% stenosis on right visually and less than 50% left.  The velocities on the right would actually be on the lower end of the 50-70% range on the right and <50% on the left.  Vertebrals were antegrade.   PAST MEDICAL/SURGICAL HISTORY:  Past Medical History:   Breast Cancer:    lower back pain:    irregular heartrate:    Back Surgery:    abdominal tumorectomy:   ALLERGIES:  Allergies:  No Known Allergies:   HOME MEDICATIONS:  Home Medications: Medication Instructions Status  meloxicam 7.5 mg oral tablet 1 tab(s) orally 2 times a day Active  omeprazole 20 mg oral delayed release capsule 1 cap(s) orally once a day (at bedtime) Active  methotrexate 2.5 mg oral tablet 4 tab(s) orally once a week on Monday Active  metoprolol tartrate 25 mg oral tablet 1 tab(s) orally 2 times a day Active  promethazine 25 mg oral tablet 1 tab(s) orally 2 times a day Active  Vitamin D 50,000 intl units (1.25 mg) oral capsule 1 cap(s) orally once a month on the first Monday of the month Active  folic acid 0.8 mg oral tablet 1 tab(s) orally once a day Active  multivitamin 1 tab(s) orally once a day Active  Vitamin D3 5000 intl units oral tablet 1 tab(s) orally once a day Active   Family and Social History:   Family History Non-Contributory    Social History negative tobacco, negative ETOH    Place of Living Home   Review of Systems:   Subjective/Chief Complaint syncope    Fever/Chills No    Cough No    Sputum No    Abdominal Pain No    Diarrhea No     Constipation No    Nausea/Vomiting No    SOB/DOE No    Chest Pain No    Telemetry Reviewed Afib    Dysuria No    Tolerating PT Yes    Tolerating Diet Yes    Medications/Allergies Reviewed Medications/Allergies reviewed   Physical Exam:   GEN well developed, well nourished, frail appearing    HEENT PERRL, hearing intact to voice    NECK No masses  trachea midline    RESP normal resp effort  no use of accessory muscles    CARD irregular rate  no JVD    ABD denies tenderness  denies Flank Tenderness    LYMPH negative neck, negative axillae    EXTR negative cyanosis/clubbing, negative edema, severe arthritic changes to the hands    SKIN normal to palpation, skin turgor good    NEURO motor/sensory function intact, but generalized weakness.  Uses walker    PSYCH alert, family provides much of history   LABS:  Laboratory Results: Cardiology:    29-Jan-14 18:01, ED ECG   Ventricular Rate 95   Atrial Rate 72   P-R Interval 166   QRS Duration 132   QT 398   QTc 500   P Axis 69   R Axis -29   T Axis 54   ECG interpretation  Sinus rhythm with frequent Premature ventricular complexes  Right bundle branch block  Abnormal ECG  When compared with ECG of 26-Dec-2012 11:20,  Premature ventricular complexes are now Present  Left anterior fascicular block is no longer Present  Criteria for Septal infarct are no longer Present  ----------unconfirmed----------  Confirmed by OVERREAD, NOT ([id number]), editor PEARSON, BARBARA ([id number]) on 01/08/2013 12:16:18 PM   ED ECG     30-Jan-14 11:44, Echo Doppler   Echo Doppler    Interpretation Summary    Left ventricular systolic function is normal.ef 65% The left atrium   is mildly dilated. The right atrium is mildly dilated. There is   moderate mitral regurgitation. There is moderate tricuspid   regurgitation.    Procedure:    A two-dimensional transthoracic echocardiogram with color flow and   Doppler was performed.    Left  Ventricle    The left ventricle is normal in size.    Left ventricular systolic function is normal.    Right Ventricle    The right ventricle isnormal in size and function.    Atria    The left atrium is mildly dilated.    The right atrium is mildly dilated.    Mitral Valve    The mitral valve leaflets appear thickened, but open well.    There is moderate mitral regurgitation.    Tricuspid Valve    The tricuspid valve is normal in structure and function.    There is moderate tricuspid regurgitation.    Aortic Valve    The aortic valve is normal in structure and function.    No aortic regurgitation is present.    Pulmonic Valve    The pulmonic valve is not well seen, but is grossly normal.    Great Vessels    The aortic root is normal size.    Pericardium/Pleural    No pericardial effusion.    MMode 2D Measurements and Calculations    RVDd: 4.1 cm    IVSd: 0.97 cm    LVIDd: 2.6 cm    LVIDs: 1.2 cm    LVPWd: 0.79 cm    FS: 52 %    EF(Teich): 85 %    Ao root diam: 2.5 cm    LA dimension: 2.1 cm    Doppler Measurements and Calculations    MV E point: 106 cm/sec    MV A point: 119 cm/sec    MV E/A: 0.89     MV dec time: 0.22 sec    TRMax vel: 279 cm/sec    TR Max PG: 31 mmHg    RVSP: 36 mmHg    RAP systole: 5.0 mmHg    Reading Physician: Serafina Royals   Sonographer: Janalee Dane RCS  Interpreting Physician:  Serafina Royals,  electronically signed on   01-08-2013 13:08:01  Requesting Physician: Isaias Cowman  Routine Chem:    29-Jan-14 10:27, Basic Metabolic Panel (w/Total Calcium)   Glucose, Serum 107   BUN 53   Creatinine (comp) 1.83   Sodium, Serum 138   Potassium, Serum 5.1   Chloride, Serum 109   CO2, Serum 20   Calcium (Total), Serum 9.0   Anion Gap 9   Osmolality (calc) 291   eGFR (African American) 28   eGFR (Non-African American) 24   eGFR values <43m/min/1.73 m2 may be an indication of chronic  kidney disease  (CKD).  Calculated eGFR is useful in patients with stable renal function.  The eGFR calculation will not be reliable in acutely  ill patients  when serum creatinine is changing rapidly. It is not useful in   patients on dialysis. The eGFR calculation may not be applicable  to patients at the low and high extremes of body sizes, pregnant  women, and vegetarians.    30-Jan-14 56:38, Basic Metabolic Panel (w/Total Calcium)   Glucose, Serum 69   BUN 40   Creatinine (comp) 1.34   Sodium, Serum 142   Potassium, Serum 4.6   Chloride, Serum 113   CO2, Serum 20   Calcium (Total), Serum 9.0   Anion Gap 9   Osmolality (calc) 291   eGFR (African American) 41   eGFR (Non-African American) 36   eGFR values <67m/min/1.73 m2 may be an indication of chronic  kidney disease (CKD).  Calculated eGFR is useful in patients with stable renal function.  The eGFR calculation will not be reliable in acutely ill patients  when serum creatinine is changing rapidly. It is not useful in   patients on dialysis. The eGFR calculation may not be applicable  to patients at the low and high extremes of body sizes, pregnant  women, and vegetarians.  Cardiac:    29-Jan-14 18:29, Troponin I   Troponin I < 0.02   0.00-0.05  0.05 ng/mL or less: NEGATIVE   Repeat testing in 3-6 hrs   if clinically indicated.  >0.05 ng/mL: POTENTIAL   MYOCARDIAL INJURY. Repeat   testing in 3-6 hrs if   clinically indicated.  NOTE: An increase or decrease   of 30% or more on serial   testing suggests a   clinically important change  Routine UA:    29-Jan-14 19:10, Urinalysis   Color (UA) Amber   Clarity (UA) Hazy   Glucose (UA) Negative   Bilirubin (UA) Negative   Ketones (UA) Negative   Specific Gravity (UA) 1.021   Blood (UA) Negative   pH (UA) 5.0   Protein (UA) Negative   Nitrite (UA) Negative   Leukocyte Esterase (UA) Negative   Result(s) reported on 07 Jan 2013 at 07:29PM.   RBC (UA) <1 /HPF   WBC (UA) 1 /HPF    Bacteria (UA)    NONE SEEN   Epithelial Cells (UA) 1 /HPF   Mucous (UA) PRESENT   Hyaline Cast (UA) 11 /LPF   Result(s) reported on 07 Jan 2013 at 07:29PM.  Routine Hem:    29-Jan-14 18:29, Hemogram, Platelet Count   WBC (CBC) 7.9   RBC (CBC) 2.85   Hemoglobin (CBC) 9.5   Hematocrit (CBC) 27.8   Platelet Count (CBC) 241   Result(s) reported on 07 Jan 2013 at 06:46PM.   MCV 97   MCH 33.4   MCHC 34.3   RDW 16.3    30-Jan-14 05:08, Hemoglobin   Hemoglobin (CBC) 8.9   Result(s) reported on 08 Jan 2013 at 07:57AM.   RADIOLOGY:  Radiology Results: XRay:    20-Sep-13 15:08, Chest PA and Lateral   Chest PA and Lateral   REASON FOR EXAM:    cough  COMMENTS:       PROCEDURE: DXR - DXR CHEST PA (OR AP) AND LATERAL  - Aug 29 2012  3:08PM     RESULT: Comparison is made to the study of March 20, 2012.    The lungs are mildly hyperinflated. There is a structure in the   retrocardiac region which likely reflects a diaphragmatic hernia   containing bowel or a partially intrathoracic stomach. The cardiac   silhouette is enlarged. There is mild  prominence of the pulmonary   interstitial markings. There is no significant pleural fluid collection   but a trace of blunting of the costophrenic gutters is present. The   patient has undergone previous Harrington rod placement in the thoracic   spine.  IMPRESSION:  The findings suggest low-grade interstitial edema which may   be of cardiac or noncardiac cause. There is no classic alveolar   pneumonia. There is a large hiatal hernia-partially intrathoracic stomach   or other diaphragmatic hernia with intestinal contents present in the   left hemithorax.     Dictation Site:2          Verified By: DAVID A. Martinique, M.D., MD    17-Jan-14 12:02, Chest Portable Single View   Chest Portable Single View   REASON FOR EXAM:    AMS WEAKNESS  COMMENTS:       PROCEDURE: DXR - DXR PORTABLE CHEST SINGLE VIEW  - Dec 26 2012 12:02PM     RESULT:  Comparison made to prior study of 08/29/2012. Harrington rods.   Lungs are clear. Cardiovascular structures are unremarkable.    IMPRESSION:  No acute abnormality.        Verified By: Osa Craver, M.D., MD    29-Jan-14 18:36, Chest Portable Single View   Chest Portable Single View   REASON FOR EXAM:    syncope  COMMENTS:       PROCEDURE: DXR - DXR PORTABLE CHEST SINGLE VIEW  - Jan 07 2013  6:36PM     RESULT: Frontal view the chest dated 01/07/2013    Comparison made to prior study dated 12/26/2012    Findings: The lungs are hyperinflated. No focal consolidation or focal is   appreciated. Cardiac silhouette is moderately enlarged. An air-fluid   level projects within the left retrocardiac region. Visualized lung   skeleton is unremarkable.    IMPRESSION:  COPD  2. Findings likerepresenting a hiatal hernia on the left.        Verified By: Mikki Santee, M.D., MD  [place]:    30-Jan-14 14:31, US Carotid Doppler Bilateral   US Carotid Doppler Bilateral   REASON FOR EXAM:    syncope  COMMENTS:       PROCEDURE: [place]  - US CAROTID DOPPLER BILATERAL  - Jan 08 2013  2:31PM     RESULT: Grayscale and color flow Doppler interrogation of the cervical   carotid systems was performed.    On the right there is soft and calcified plaque in the in the distal CCA,   the carotid bulb, and proximal ICA with degree of stenosis estimated   approximately 75%. There is soft and calcified plaque on the left in the   in the distal CCA, bulb, and proximal ICA with no evidence of stenosis   greater than 50%. The waveform patterns and color flow images do not   suggest high-grade stenotic lesions.   On the right the peak internal carotid systolic velocity measured 193   cm/sec and the peak common carotid velocity measured 124 cm/ sec   corresponding to a normal ratio of 1.6. On the left the peak internal   carotid systolic velocity measured 108 cm/sec and the peak common carotid   velocity  measured 118 cm/sec corresponding to a normal ratio of 0.9. The   vertebral arteries are normal in flow direction bilaterally.    IMPRESSION:  There are moderate amounts of calcified and soft plaque   bilaterally. The degree of stenosis in the  bulb and proximal ICA on the   right approaches 75%. On the left the degree of stenosis is no greater   than 50%.     Dictation Site: 2      Verified By: DAVID A. Martinique, M.D., MD  LabUnknown:    05-Jul-13 12:45, [id number]   [id number]   REASON FOR EXAM:    lower back pain  COMMENTS:       PROCEDURE: KDR - KDXR LUMBAR SPINE AP AND LATERAL  - Jun 13 2012 12:45PM     RESULT:     Harrington rods are seen at the upper margin of the film. Lumbar spine   alignment appears to be maintained.Slight loss of height is noted in L1   and L2 without a definite compression fracture demonstrated. These areas   are obscured in the anterior view by the Harrington rods. No acute bony   abnormality is evident otherwise. Facet hypertrophy is most prominent at   L5-S1. Mild degenerative changes are seen in the hips and sacroiliac   joints.    IMPRESSION:  Harrington rods over the thoracic spine extending   posteriorly to the L3 level. Slight loss of height of L1 and L2 of   uncertain chronicity.    Dictation Site: 2      Thank you for this opportunity to contribute to the care of your patient.           Verified By: Sundra Aland, M.D., MD    17-Jan-14 12:02, Chest Portable Single View   PACS Image    29-Jan-14 18:36, Chest Portable Single View   PACS Image    29-Jan-14 19:58, CT Head Without Contrast   PACS Image    30-Jan-14 14:31, US Carotid Doppler Bilateral   PACS Image  CT:    29-Jan-14 19:58, CT Head Without Contrast   CT Head Without Contrast   REASON FOR EXAM:    syncope  COMMENTS:       PROCEDURE: CT  - CT HEAD WITHOUT CONTRAST  - Jan 07 2013  7:58PM     RESULT: Technique: Helical noncontrasted 5 mm sections were obtained from    the skull base through the vertex.    Findings: Diffuse cortical and cerebellar atrophy is identified as well   as diffuse areas of low attenuation within the subcortical, deep and   periventricular white matter regions. There is not evidence of   intra-axial nor extra-axial fluid collections, acute hemorrhage, mass   effect, nor a depressed skull fracture. The visualized paranasal sinuses   and mastoid air cells are patent.  IMPRESSION:  Chronic and involutional changes without evidence of acute   abnormalities. If there is persistent clinical concern further evaluation   with MRI is recommended.           Thank you for the opportunity to contribute to the care of your patient.        Verified By: Mikki Santee, M.D., MD   ASSESSMENT AND PLAN:   Assessment/Admission Diagnosis moderate right ICA stensis, mild left ICA stenosis, antegrade vertebrals. Syncope    Plan Her [place] suggested approaching 75% stenosis on right visually and less than 50% left.  The velocities on the right would actually be on the lower end of the 50-70% range on the right and <50% on the left.  Vertebrals were antegrade.  Visual inspection is a very unreliable way of assessing carotid stenosis and velocity criteria are much more important.  Can not do  CT angigoram due to renal insufficiency.  Would not recommend any intervention given her age, non focal symptoms, and moderate disease.  The syncope was likely not related to her carotid disease.  Will see in office in several weeks.   Electronic Signatures: Algernon Huxley (MD)  (Signed 30-Jan-14 16:56)  Authored: Chief Complaint and History, PAST MEDICAL/SURGICAL HISTORY, ALLERGIES, HOME MEDICATIONS, Family and Social History, Review of Systems, Physical Exam, LABS, RADIOLOGY, Assessment and Plan   Last Updated: 30-Jan-14 16:56 by Algernon Huxley (MD)

## 2015-04-01 NOTE — Discharge Summary (Signed)
PATIENT NAME:  Veronica Robbins, Veronica Robbins MR#:  [id number] DATE OF BIRTH:  02/03/1923  DATE OF ADMISSION:  01/07/2013 DATE OF DISCHARGE:  01/09/2013  ADMITTING DIAGNOSIS: Syncope.   DISCHARGE DIAGNOSES:  1.  Syncope, suspected global cerebral  hypoperfusion related due to dehydration.  2.  Carotid stenosis, right about 75%, left about 50%.  3.  Dehydration.  4.  Acute renal failure, resolved on intravenous fluids.  5.  Moderate mitral regurgitation as well as tricuspid regurgitation with normal left ventricular function on echocardiogram.  6.  Relative hypotension. 7.  Generalized weakness.  8.  Left foot plantar surface ulcer.  9.  History of arrhythmias, in sinus rhythm now.  10.  History of osteoarthritis. 11.  Dementia. 12.  Osteomyelitis.  13.  Back pain in the remote past.   DISCHARGE CONDITION: Fair.  DISCHARGE MEDICATIONS: The patient is to resume her outpatient medications which are:  1.  Methotrexate 2.5 mg oral tablet 4 tablets once a week.  2.  Metoprolol tartrate 25 mg p.o. twice daily.  3.  Promethazine 25 mg p.o. twice daily.  4.  Vitamin D 1.25 mg once monthly.  5.  Folic acid 0.8 mg p.o. daily.  6.  Multivitamin once daily.  7.  Vitamin D3 5000 international units once daily.  8.  Omeprazole 20 mg p.o. at bedtime.  9.  Aspirin 81 mg p.o. daily.  NOTE: The patient is not to take meloxicam, if possible.   DIET: 2 grams salt. The patient was advised to drink plenty of fluids. Mechanical consistency.   ACTIVITY LIMITATIONS: As tolerated.   REFERRAL: Home health physical therapy 2 to 7 times a week. Also, RN and aide for home.   FOLLOW-UP APPOINTMENTS:  1. With Dr. Venia Minks in 2 days after discharge.  2. With Dr. Cleda Mccreedy, Podiatry, at 2:15 on 01/09/2013 in his office.  3. With Dr. Lucky Cowboy in 1 to 2 weeks after discharge.    CONSULTANTS: Care management, palliative care, Dr. Leotis Pain.   LABORATORY, DIAGNOSTIC AND RADIOLOGICAL DATA:  Chest x-ray, portable single view on the  01/07/2013, showed findings likely representing hiatal hernia on the left. CT scan of head without contrast 01/07/2013 revealed chronic and involutional changes without evidence of acute abnormalities. Ultrasound of carotid arteries bilaterally 01/08/2013 showed a moderate amount of calcified and soft plaque bilaterally. The degree of stenosis in the bulb and proximal RCA on the right approaches 75%.  On the left, the degree of stenosis is no greater than 50%. Left foot complete x-ray on 01/09/2013 revealed no plain film evidence of osteomyelitis.  There are soft tissue findings consistent with cellulitis at the level of the first metatarsophalangeal joint. There may be a partially healed fracture of the base of the proximal phalanx of the fifth toe. Echocardiogram 01/08/2013 revealed left ventricular systolic function normal, ejection fraction 65%. Left atrium is mildly dilated. Right atrium is mildly dilated. There is moderate mitral regurgitation. There is moderate tricuspid regurgitation.  EKG showed sinus rhythm at 95 beats per minute, frequent premature ventricular complexes, right bundle branch block. Nonspecific ST-T changes were also present. Chest x-ray was unremarkable as well as the patient's CT of head.   On 01/07/2013, glucose was 107, BUN and creatinine were 53 and 1.83, otherwise BMP was unremarkable. The patient's bicarbonate level was low at 20, otherwise BMP was unremarkable. The patient's troponin was less than 0.02. White blood cell count was 7.9, hemoglobin was 9.5, and platelet count was 241. The patient's urinalysis revealed amber/hazy urine,  negative for glucose, bilirubin or ketones, specific gravity was 1.021, pH was 5.0, negative for blood, protein, nitrites or leukocyte esterase, less than 1 red blood cell, 1 white blood cell, no bacteria, 1 epithelial cell. Mucus is present as well as 11 hyaline casts.   HISTORY AND PHYSICAL: The patient is an 81 year old Caucasian female with past  medical history significant for history of rheumatoid arthritis, also osteoarthritis and dementia, who presented to the hospital with complaints of syncopal episode. Apparently, the patient was in her usual health and was found to be pale-looking, head down and was unresponsive according to medical records for approximately 15 minutes. A relative called EMS and transported the patient to the hospital. During transportation, the patient became fully awake and responsive, no specific complaints were noted. She was admitted for 24-hour observation. The patient's vital signs:  Blood pressure was 134/50, respiratory rate was 20, temperature was 97.6, pulse was 70, and oxygen saturation was 96%. The physical exam was unremarkable. The patient did have ulceration of her plantar surface of the left metatarsal head area, but no significant cellulitic changes were noted. The patient was admitted to the hospital.    HOSPITAL COURSE: The patient was admitted to the hospital for further evaluation. She was admitted to a monitored bed, and further evaluation of her syncope was entertained. The patient had orthostatic vital signs checked, and the patient was noted to have blood pressure of 105 whenever she was lying. However, while she was sitting her blood pressure went up to 834 systolic. She did not have any documented orthostatic vital signs checked in ER , however, the patient's orthostatic vital signs were checked only on 01/08/2013 after she received IV fluids. She was noted to be dehydrated with elevated creatinine level as well as BUN level. She was given IV fluids, and her BUN as well as creatinine improved. By 01/09/2013, the patient's BUN and creatinine were 22 and 0.93. It was felt that the patient was intravascularly depleted, dehydrated, and very likely that was the reason of her syncope episodes. Moreover, she was noted to have bilateral carotid artery stenosis. She was evaluated by Dr. Leotis Pain, a vascular  surgeon, on 01/08/2013, who felt the patient had moderate right ICA stenosis as well as mild left ICA stenosis and antegrade vertebral flow. He felt that even if ultrasound suggested approaching 75% stenosis on the right visually and less than 50 on the left, velocities on the right would actually be on the lower range, around 50 to 70 range on the right as well as less than 50 on the left.  He felt that visual inspection is very unreliable way of assessing carotid stenosis and velocity criteria are much more important. He stated that he could not do angiogram due to renal insufficiency and would not recommend any intervention given her age, nonfocal symptoms, as well as moderate disease.  The syncope, he felt, was not related to carotid disease; however, I recommended the patient to follow up with him in approximately a few weeks after discharge.   As mentioned above, the patient was rehydrated and she felt much more comfortable. She was evaluated by a physical therapist and recommended home health physical therapy. She was also evaluated by a palliative care nurse practitioner, Mr. Altha Harm.  Vonna Kotyk Borders met with the patient's family, a niece as well as nephew who share healthcare power of attorney, to discuss medical goals.  They intended  to take the patient home upon discharge and care for  her in this setting until the end of her life.  They also discussed disposition, including home health versus hospice.  They were very interested in helping her at home. The plan is for the patient to go home with home health initially, and then the family will speak to Dr. Venia Minks and hospice about initiation of possibly hospice care at home.  They also discussed about advance directives. The patient's family showed Mr. Regenia Skeeter the patient's living will and stated that the patient wished to have DO NOT RESUSCITATE order. They were interested in completion of a portable form, which was signed and placed on the  chart. So, the patient is being discharged home with home health, [facility] and then likely hospice.    On the day of discharge, the patient felt satisfactory and did not complain of any significant discomfort. Her vital signs were stable with a temperature of  97.8, pulse was 94, ranging from 80s to 90s, and respiratory rate was 18, blood pressure 135/58, saturation was 95% on room air at rest.   TIME SPENT: 40 minutes.   ____________________________ Theodoro Grist, MD rv:cb D: 01/11/2013 18:37:43 ET T: 01/12/2013 10:01:52 ET JOB#: [id number]  cc: Theodoro Grist, MD, <Dictator> Sharlotte Alamo, DPM Jerrell Belfast, MD Algernon Huxley, MD [place] MD ELECTRONICALLY SIGNED 01/29/2013 18:59
